# Patient Record
Sex: Male | Born: 1965 | Race: White | Hispanic: No | Smoking: Never smoker
Health system: Southern US, Community
[De-identification: ages and names within clinical notes are randomized; demographics above are authoritative.]

---

## 2008-10-17 ENCOUNTER — Encounter: Admission: RE | Admit: 2008-10-17 | Discharge: 2008-10-17 | Payer: Self-pay | Admitting: Family Medicine

## 2008-10-17 ENCOUNTER — Inpatient Hospital Stay (HOSPITAL_COMMUNITY): Admission: EM | Admit: 2008-10-17 | Discharge: 2008-10-31 | Payer: Self-pay | Admitting: Emergency Medicine

## 2008-10-17 ENCOUNTER — Encounter (INDEPENDENT_AMBULATORY_CARE_PROVIDER_SITE_OTHER): Payer: Self-pay | Admitting: General Surgery

## 2008-10-24 ENCOUNTER — Encounter (INDEPENDENT_AMBULATORY_CARE_PROVIDER_SITE_OTHER): Payer: Self-pay | Admitting: General Surgery

## 2009-10-20 IMAGING — CR DG ABDOMEN 2V
2 series · 2 of 2 positions shown · non-contrast
Comparison: CT abdomen and pelvis 10/17/2008.

CLINICAL DATA: Abdominal pain.  Acute appendicitis.

ABDOMEN - 2 VIEW

[w abdomen upright]
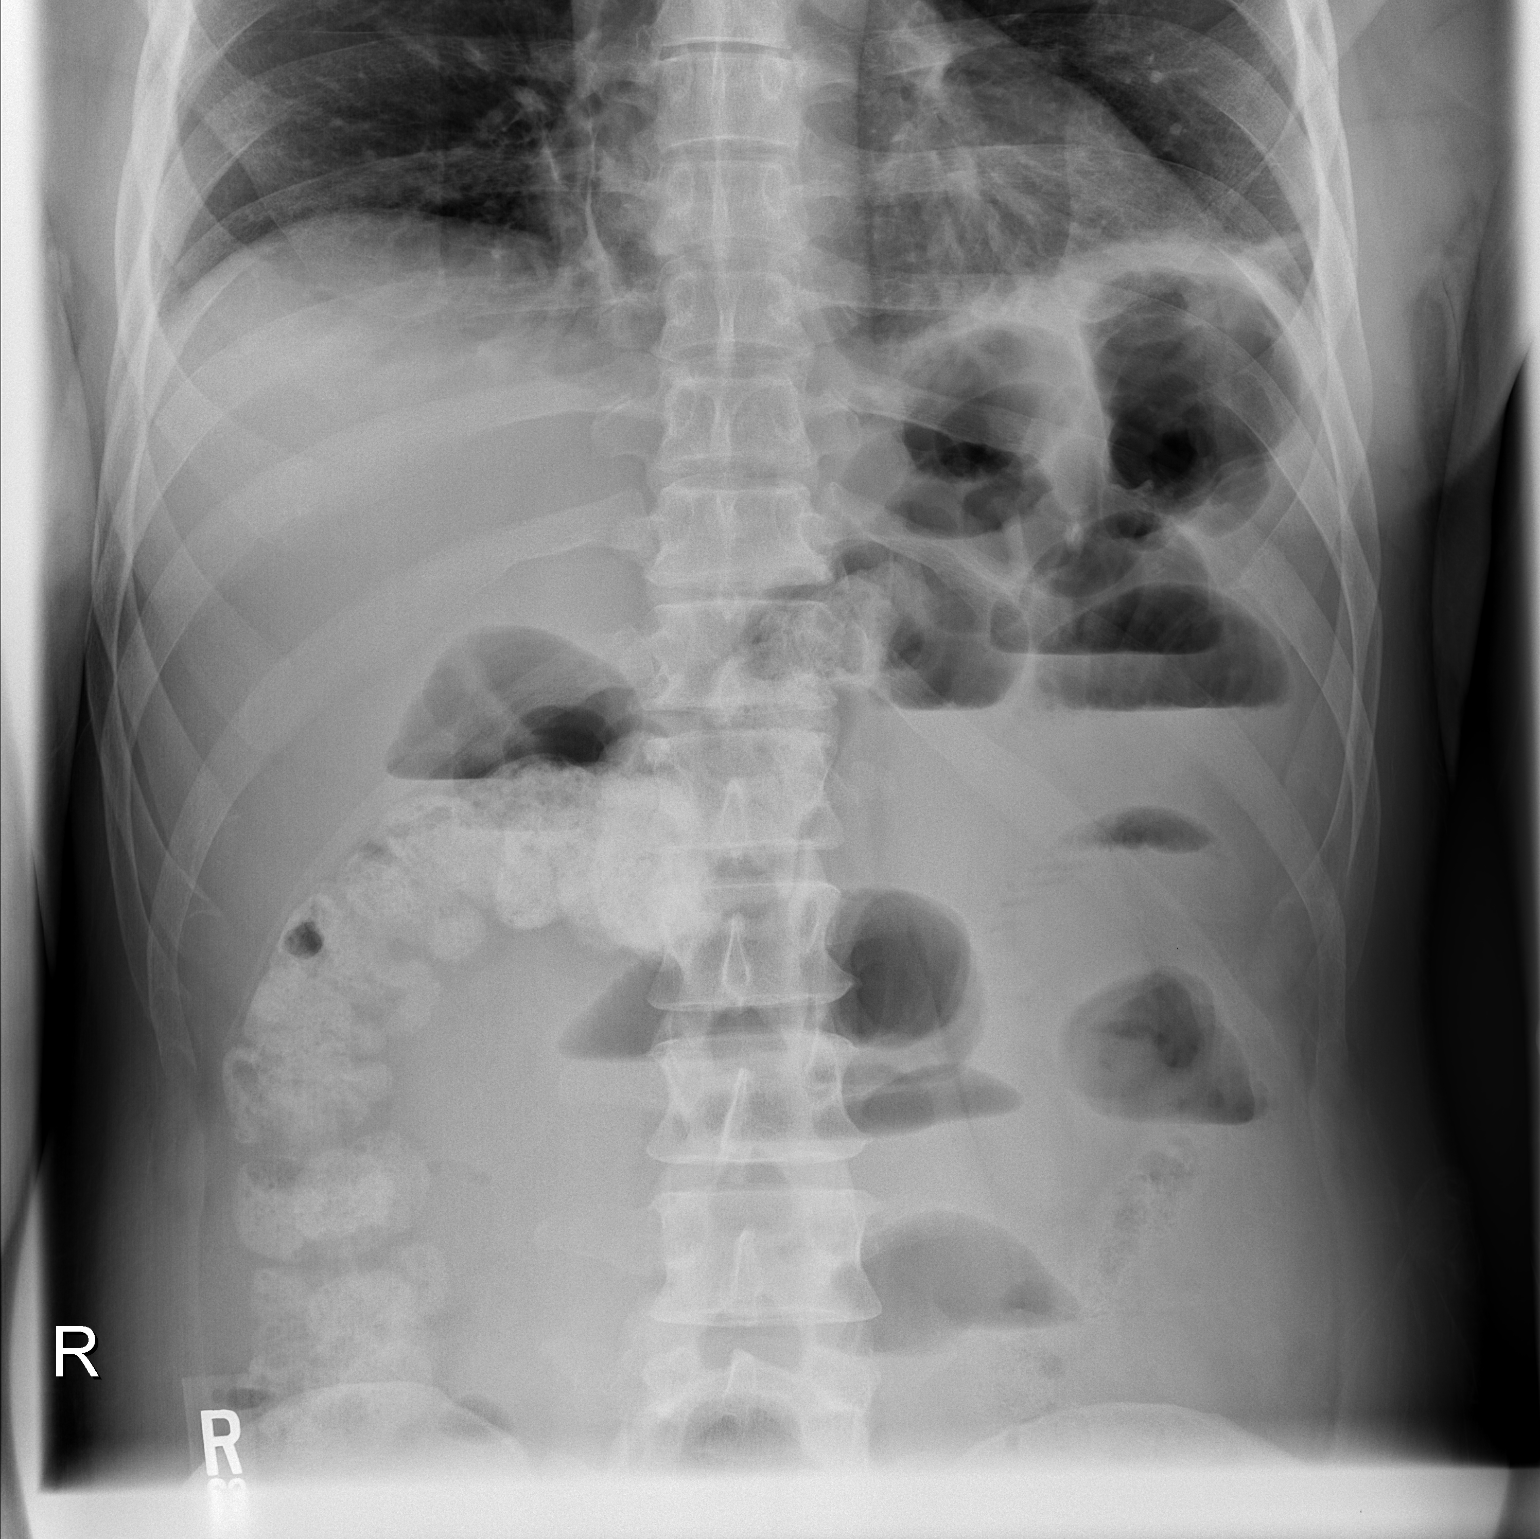

[t abdomen supine]
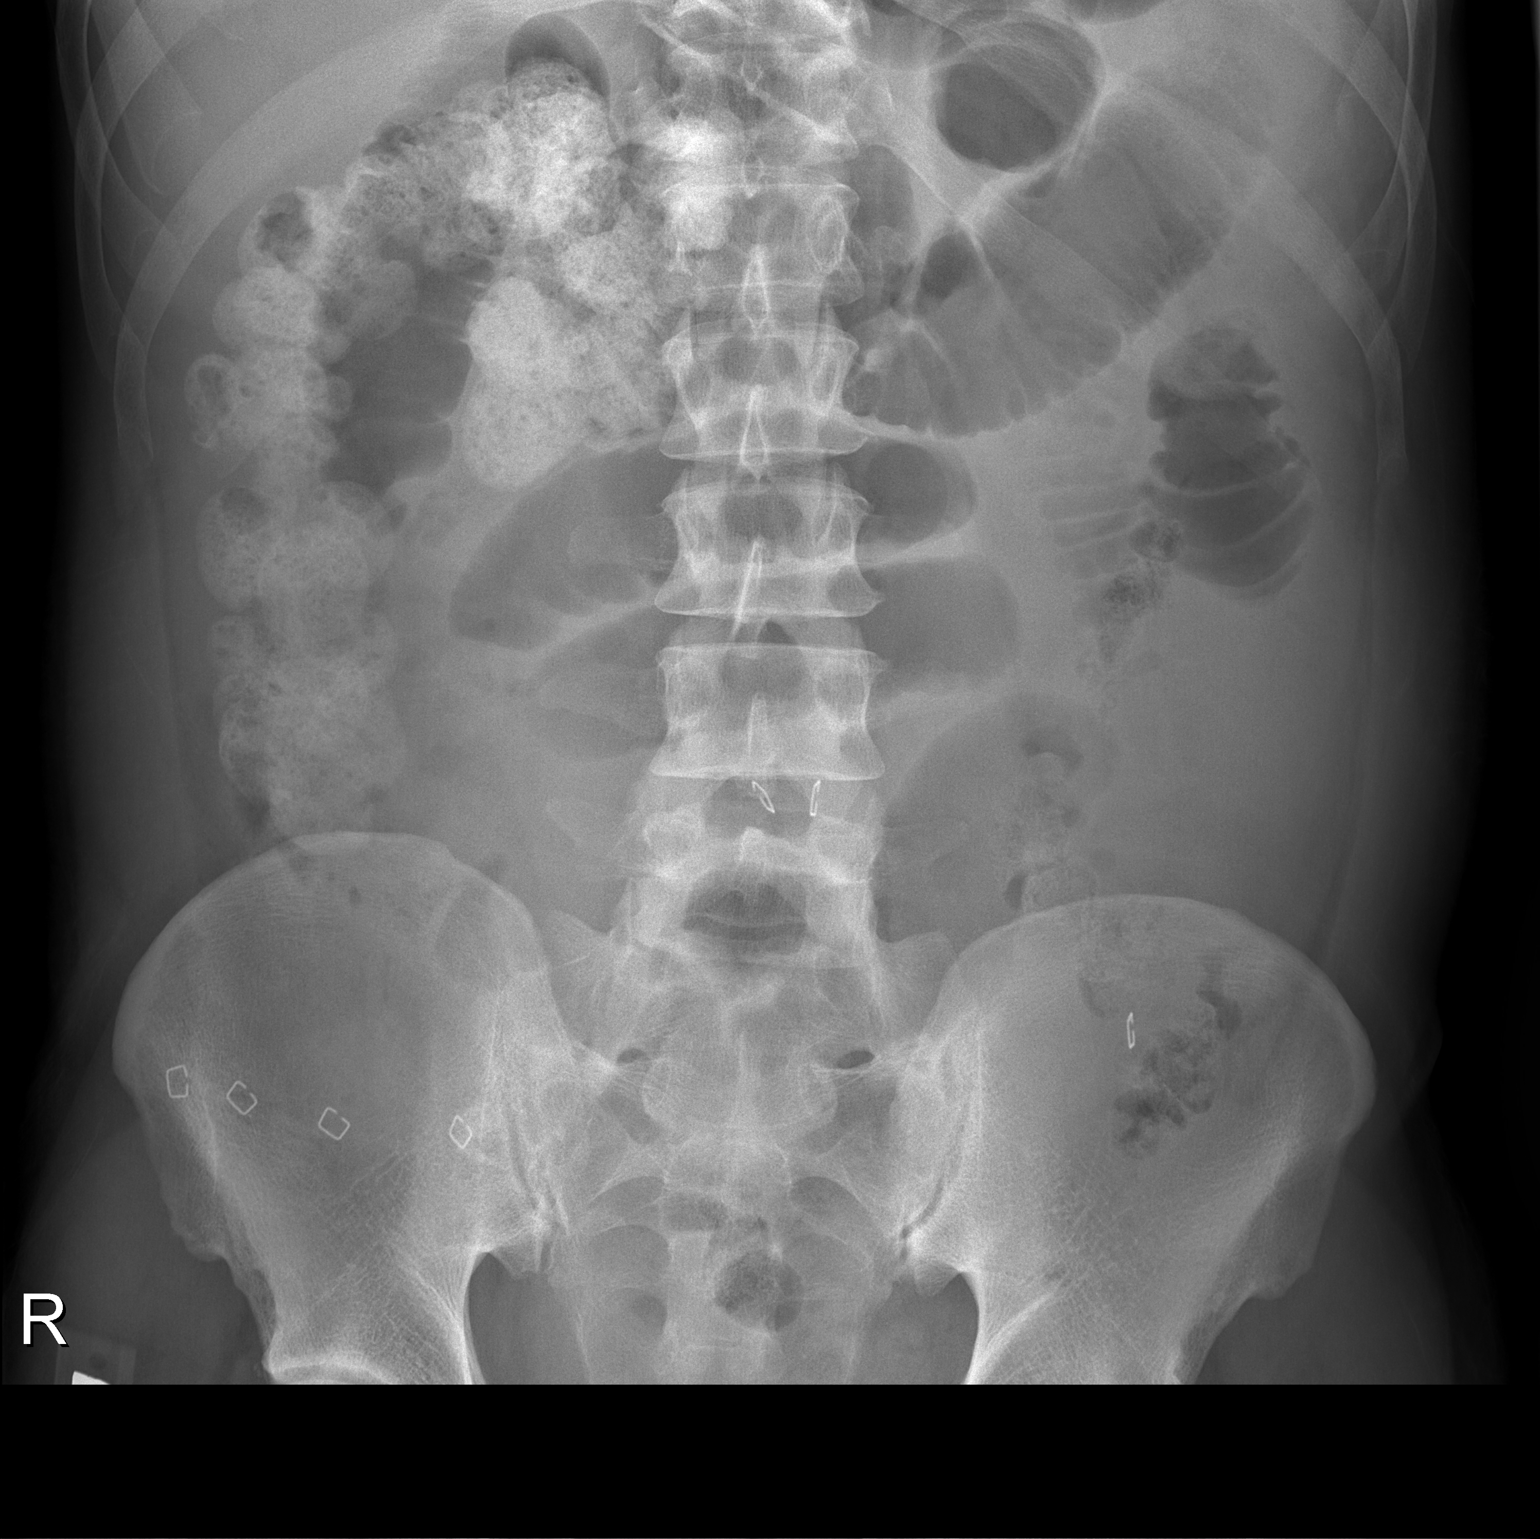

[2 of 2 positions shown; findings below may reference images not displayed]

FINDINGS: No free intraperitoneal air is identified.  There is
dilatation of small bowel loops of to 5.3 cm with air-fluid levels
present.  Contrast material from the patient's CT scan is seen in
the ascending colon.  Calcific density projecting in the inferior
aspect the right pelvis is noted.  No free intraperitoneal air.
Bibasilar atelectasis seen.
IMPRESSION: 1.  Bowel gas pattern most compatible with postoperative ileus.
2.  Calcification in the right hemi pelvis may represent what of
the appendicoliths seen on the patient's CT scan.

## 2010-09-10 LAB — BASIC METABOLIC PANEL
BUN: 12 mg/dL (ref 6–23)
BUN: 7 mg/dL (ref 6–23)
CO2: 29 mEq/L (ref 19–32)
Calcium: 8.2 mg/dL — ABNORMAL LOW (ref 8.4–10.5)
Calcium: 8.5 mg/dL (ref 8.4–10.5)
Chloride: 100 mEq/L (ref 96–112)
Chloride: 103 mEq/L (ref 96–112)
Chloride: 99 mEq/L (ref 96–112)
Creatinine, Ser: 0.91 mg/dL (ref 0.4–1.5)
Creatinine, Ser: 0.93 mg/dL (ref 0.4–1.5)
Creatinine, Ser: 1.04 mg/dL (ref 0.4–1.5)
GFR calc Af Amer: >60 mL/min (ref >60)
GFR calc Af Amer: >60 mL/min (ref >60)
GFR calc Af Amer: >60 mL/min (ref >60)
GFR calc non Af Amer: >60 mL/min (ref >60)
GFR calc non Af Amer: >60 mL/min (ref >60)
Glucose, Bld: 116 mg/dL — ABNORMAL HIGH (ref 70–99)
Glucose, Bld: 142 mg/dL — ABNORMAL HIGH (ref 70–99)
Potassium: 3.8 mEq/L (ref 3.5–5.1)
Sodium: 134 mEq/L — ABNORMAL LOW (ref 135–145)
Sodium: 136 mEq/L (ref 135–145)

## 2010-09-10 LAB — DIFFERENTIAL
Basophils Relative: 1 % (ref 0–1)
Basophils Relative: 3 % — ABNORMAL HIGH (ref 0–1)
Eosinophils Absolute: 0 10*3/uL (ref 0.0–0.7)
Eosinophils Relative: 3 % (ref 0–5)
Lymphocytes Relative: 10 % — ABNORMAL LOW (ref 12–46)
Lymphocytes Relative: 15 % (ref 12–46)
Lymphs Abs: 1 10*3/uL (ref 0.7–4.0)
Lymphs Abs: 1 10*3/uL (ref 0.7–4.0)
Lymphs Abs: 1 10*3/uL (ref 0.7–4.0)
Monocytes Absolute: 1.4 10*3/uL — ABNORMAL HIGH (ref 0.1–1.0)
Monocytes Relative: 10 % (ref 3–12)
Monocytes Relative: 10 % (ref 3–12)
Monocytes Relative: 12 % (ref 3–12)
Neutro Abs: 12 10*3/uL — ABNORMAL HIGH (ref 1.7–7.7)
Neutro Abs: 7.3 10*3/uL (ref 1.7–7.7)
Neutrophils Relative %: 77 % (ref 43–77)
Neutrophils Relative %: 82 % — ABNORMAL HIGH (ref 43–77)

## 2010-09-10 LAB — GLUCOSE, CAPILLARY
Glucose-Capillary: 102 mg/dL — ABNORMAL HIGH (ref 70–99)
Glucose-Capillary: 111 mg/dL — ABNORMAL HIGH (ref 70–99)
Glucose-Capillary: 112 mg/dL — ABNORMAL HIGH (ref 70–99)
Glucose-Capillary: 114 mg/dL — ABNORMAL HIGH (ref 70–99)
Glucose-Capillary: 121 mg/dL — ABNORMAL HIGH (ref 70–99)
Glucose-Capillary: 124 mg/dL — ABNORMAL HIGH (ref 70–99)
Glucose-Capillary: 126 mg/dL — ABNORMAL HIGH (ref 70–99)
Glucose-Capillary: 126 mg/dL — ABNORMAL HIGH (ref 70–99)
Glucose-Capillary: 128 mg/dL — ABNORMAL HIGH (ref 70–99)
Glucose-Capillary: 128 mg/dL — ABNORMAL HIGH (ref 70–99)

## 2010-09-10 LAB — CBC
HCT: 32.6 % — ABNORMAL LOW (ref 39.0–52.0)
HCT: 34.2 % — ABNORMAL LOW (ref 39.0–52.0)
HCT: 44 % (ref 39.0–52.0)
Hemoglobin: 11.4 g/dL — ABNORMAL LOW (ref 13.0–17.0)
Hemoglobin: 11.7 g/dL — ABNORMAL LOW (ref 13.0–17.0)
Hemoglobin: 14.4 g/dL (ref 13.0–17.0)
MCHC: 33.2 g/dL (ref 30.0–36.0)
MCHC: 33.4 g/dL (ref 30.0–36.0)
MCHC: 33.8 g/dL (ref 30.0–36.0)
MCHC: 34 g/dL (ref 30.0–36.0)
MCHC: 35.4 g/dL (ref 30.0–36.0)
MCV: 89.3 fL (ref 78.0–100.0)
MCV: 90.3 fL (ref 78.0–100.0)
MCV: 90.5 fL (ref 78.0–100.0)
MCV: 90.8 fL (ref 78.0–100.0)
MCV: 91.2 fL (ref 78.0–100.0)
Platelets: 177 10*3/uL (ref 150–400)
Platelets: 181 10*3/uL (ref 150–400)
Platelets: 182 10*3/uL (ref 150–400)
Platelets: 222 10*3/uL (ref 150–400)
RBC: 3.62 MIL/uL — ABNORMAL LOW (ref 4.22–5.81)
RBC: 3.92 MIL/uL — ABNORMAL LOW (ref 4.22–5.81)
RBC: 4.07 MIL/uL — ABNORMAL LOW (ref 4.22–5.81)
RBC: 4.71 MIL/uL (ref 4.22–5.81)
RDW: 12.2 % (ref 11.5–15.5)
RDW: 12.5 % (ref 11.5–15.5)
RDW: 13.1 % (ref 11.5–15.5)
WBC: 14.7 10*3/uL — ABNORMAL HIGH (ref 4.0–10.5)
WBC: 6.5 10*3/uL (ref 4.0–10.5)
WBC: 7.7 10*3/uL (ref 4.0–10.5)

## 2010-09-10 LAB — PHOSPHORUS
Phosphorus: 3 mg/dL (ref 2.3–4.6)
Phosphorus: 3.4 mg/dL (ref 2.3–4.6)
Phosphorus: 4.1 mg/dL (ref 2.3–4.6)

## 2010-09-10 LAB — COMPREHENSIVE METABOLIC PANEL
ALT: 20 U/L (ref 0–53)
ALT: 24 U/L (ref 0–53)
AST: 22 U/L (ref 0–37)
AST: 23 U/L (ref 0–37)
Albumin: 2.3 g/dL — ABNORMAL LOW (ref 3.5–5.2)
Alkaline Phosphatase: 43 U/L (ref 39–117)
BUN: 5 mg/dL — ABNORMAL LOW (ref 6–23)
BUN: 8 mg/dL (ref 6–23)
CO2: 29 mEq/L (ref 19–32)
CO2: 31 mEq/L (ref 19–32)
Calcium: 7.6 mg/dL — ABNORMAL LOW (ref 8.4–10.5)
Calcium: 8.1 mg/dL — ABNORMAL LOW (ref 8.4–10.5)
Chloride: 102 mEq/L (ref 96–112)
Chloride: 102 mEq/L (ref 96–112)
Creatinine, Ser: 0.66 mg/dL (ref 0.4–1.5)
Creatinine, Ser: 0.66 mg/dL (ref 0.4–1.5)
Creatinine, Ser: 0.76 mg/dL (ref 0.4–1.5)
GFR calc Af Amer: >60 mL/min (ref >60)
GFR calc Af Amer: >60 mL/min (ref >60)
GFR calc non Af Amer: >60 mL/min (ref >60)
GFR calc non Af Amer: >60 mL/min (ref >60)
Glucose, Bld: 104 mg/dL — ABNORMAL HIGH (ref 70–99)
Glucose, Bld: 133 mg/dL — ABNORMAL HIGH (ref 70–99)
Sodium: 134 mEq/L — ABNORMAL LOW (ref 135–145)
Sodium: 138 mEq/L (ref 135–145)
Total Bilirubin: 0.8 mg/dL (ref 0.3–1.2)
Total Bilirubin: 0.8 mg/dL (ref 0.3–1.2)
Total Protein: 4.7 g/dL — ABNORMAL LOW (ref 6.0–8.3)
Total Protein: 5.3 g/dL — ABNORMAL LOW (ref 6.0–8.3)

## 2010-09-10 LAB — WOUND CULTURE

## 2010-09-10 LAB — TRIGLYCERIDES: Triglycerides: 75 mg/dL (ref <150)

## 2010-09-10 LAB — CLOSTRIDIUM DIFFICILE EIA: C difficile Toxins A+B, EIA: NEGATIVE

## 2010-09-10 LAB — ANAEROBIC CULTURE

## 2010-09-10 LAB — GRAM STAIN

## 2010-09-10 LAB — CULTURE, ROUTINE-ABSCESS: Culture: NO GROWTH

## 2010-09-10 LAB — CHOLESTEROL, TOTAL: Cholesterol: 113 mg/dL (ref 0–200)

## 2010-09-10 LAB — PREALBUMIN: Prealbumin: 6.2 mg/dL — ABNORMAL LOW (ref 18.0–45.0)

## 2010-10-15 NOTE — Op Note (Signed)
NAME:  Jeffery Massey, Jeffery Massey NO.:  [id number]   MEDICAL RECORD NO.:  [id number]          PATIENT TYPE:  INP   LOCATION:  1321                         FACILITY:  [facility]   PHYSICIAN:  Angelia Mould. Derrell Lolling, M.D.DATE OF BIRTH:  Dec 26, 1965   DATE OF PROCEDURE:  10/24/2008  DATE OF DISCHARGE:                               OPERATIVE REPORT   PREOPERATIVE DIAGNOSIS:  Small-bowel obstruction, pelvic abscess,  retained appendicolith.   POSTOPERATIVE DIAGNOSIS:  Small-bowel obstruction secondary to  adhesions, early pelvic abscess, retained appendicolith.   OPERATION PERFORMED:  1. Exploratory laparotomy with lysis of adhesions to release small      bowel obstruction.  2. Drainage of pelvic abscess.  3. Removal of pelvic appendicolith.   SURGEON:  Dr. Claud Kelp.   FIRST ASSISTANT:  Dr. Glenna Fellows.   OPERATIVE INDICATIONS:  This is a 45 year old Caucasian male who  underwent diagnostic laparoscopy and open appendectomy for ruptured  appendicitis with abscess on Oct 17, 2008.  In retrospect, he probably  had been sick for over 1 week.  He initially did well but then developed  abdominal distention and vomiting and radiographic pattern which  initially looked like an ileus, progressed to one of more of mechanical  obstruction.  CT scan yesterday showed a pelvic fluid collection with a  retained appendicolith and was beginning to develop wall enhancement.  The small bowel was dilated all the way down to the right lower  quadrant. I felt that he would need re-exploration to remove the  appendicolith, to drain the infected fluid out of his pelvis and to  evaluate him for an acute postop small-bowel obstruction.  After  extensive discussion, this was decided upon and he is brought to  operating room.   OPERATIVE FINDINGS:  The patient appeared to have a partial small-bowel  obstruction due to adhesions in the right lower quadrant.  The last 6  inches of terminal ileum  appeared to have rolled over themselves and  adhered to the cecal closure and inflammatory change in the right lower  quadrant.  Once these adhesions were taken down, I was able to milk air  and liquid stool through the small bowel into the right colon without  any difficulty.  There is no sign of any injury to the bowel.  There was  a lot of ascites in the abdomen which was slightly blood tinged, but  there was no odor or purulence that I could find.  The pelvic abscess  really was not very well developed and was mostly the cloudy fluid  without odor.  I did remove the appendicolith from the pelvis.   OPERATIVE TECHNIQUE:  Following induction of general endotracheal  anesthesia, a Foley catheter was inserted.  The patient's abdomen was  prepped and draped in sterile fashion.  The patient was identified as  correct patient and correct procedure.   The right lower quadrant transverse incision was observed and the  staples were removed.  I opened up the skin.  I extended this incision  all the way to the midline and divided the subcutaneous tissue.  I then  carefully took the PDS  sutures out of the anterior rectus sheath and the  external oblique laterally and then out of the internal oblique and  transversus abdominis laterally.  I divided the medial aspect of the  rectus muscle with electrocautery.  I carefully elevated the posterior  rectus sheath and with a knife carefully made a pinhole entry into the  abdominal cavity which was performed without any injury to the bowel.  We then slowly opened this hole and evacuated a lot of clear to slightly  blood tinged ascites.  We evacuated the small bowel and then identified  the terminal ileum and ran that all the way down to the right lower  quadrant.  We identified the right colon and cecum.  We had some kinking  of the terminal ileum and those adhesions were taken down mostly bluntly  but slightly sharply and once we had this completely  free, we could  completely trace the small bowel all the way to the ileocecal valve and  milk small bowel content easily into the cecum.  We felt that we had  resolved the small-bowel obstruction.  We examined the rest of the small  bowel and there was no other adhesions.  I then removed the small bowel  from the pelvis.  I felt down in the pelvis and felt the appendicolith  and removed that.  That was sent to the lab for documentation.  I took  cultures of the pelvic fluid but really there was no well formed abscess  and there was no well formed abscess cavity, so we did not feel the  drains were necessary.   We then ran the small bowel one more time and irrigated the abdomen and  pelvis extensively with about 4 L of saline.  There did not appear to be  any bleeding or any further fluid to remove.  We returned the small  bowel to its anatomic position.  We closed the internal oblique and  transversus abdominis and the posterior rectus sheath with running  suture of #1 PDS.  We closed the external oblique and the anterior  rectus sheath with running suture of #1 PDS.  We closed the skin with  about three or four skin staples but mostly packed it open with Telfa  and covered with 4x4 gauze.  Clean bandages were placed and the patient  was taken to the recovery room in stable condition.  Estimated blood  loss was about 100 mL.  Complications none.  Sponge, needle and  instrument counts were correct.      Angelia Mould. Derrell Lolling, M.D.  Electronically Signed     HMI/MEDQ  D:  10/24/2008  T:  10/25/2008  Job:  [id number]

## 2010-10-15 NOTE — H&P (Signed)
NAME:  Jeffery Massey, Jeffery Massey NO.:  [id number]   MEDICAL RECORD NO.:  [id number]          PATIENT TYPE:  INP   LOCATION:  1321                         FACILITY:  [facility]   PHYSICIAN:  Angelia Mould. Derrell Lolling, M.D.DATE OF BIRTH:  06/27/1965   DATE OF ADMISSION:  10/17/2008  DATE OF DISCHARGE:                              HISTORY & PHYSICAL   CHIEF COMPLAINT:  Right lower quadrant abdominal pain and nausea.   HISTORY OF PRESENT ILLNESS:  This is a healthy 45 year old gentleman who  has had no prior abdominal problems.  For the past 2 weeks, he states  that his abdomen has felt a little funny and a little tight, but he  has been reportedly well otherwise, exercising ,tolerating a diet and  having bowel movements.  His symptomatology changed yesterday afternoon  when he noted diminished appetite and his abdomen was a little bit  tighter.  At about midnight last night, he noted worsening pain and  cramps, and had a normal bowel movement.  He has had 1-2 episodes of  chills.  Early this morning, he noted that the pain was on the right  side.  He was more fatigued and nauseated, but did not vomit.  He has  not had any diarrhea.  Denies prior episodes.  Denies urologic symptoms.   He went to [facility].  A CT scan was performed.  This shows an  abnormally thickened, enlarged and inflamed appendix with 2  appendicoliths.  There is a significant inflammatory reaction around  this, but no abscess.  There is a little bit of fluid of the right  pericolic gutter and a little bit of fluid in the pelvis.  There are no  other abnormalities mentioned.  I reviewed this with Dr. Genevive Bi  in the Radiology Department.   I was called to see the patient by the emergency department physician.   PAST HISTORY:  In August 2090, he had a parathyroid adenoma resected in  [place], [place].  He has had no other medical or surgical problems.   CURRENT MEDICATIONS:  Multivitamins.   ALLERGIES:  CODEINE causes sweating and cramps.   SOCIAL HISTORY:  He is married with 2 children.  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  He plans to move his family down here later.  Both  his mother and father live in [place] already.  He denies tobacco.  He drinks 1 alcoholic beverage a day.   FAMILY HISTORY:  Mother and father are with him.  They are living and  well.  Father states he has an enlarged prostate.   REVIEW OF SYSTEMS:  A 10-system review of systems is performed and is  noncontributory except for what is listed above in the history.   PHYSICAL EXAMINATION:  GENERAL:  Pleasant healthy-appearing gentleman in  mild-moderate distress.  VITAL SIGNS:  Temperature 100.1, blood pressure 136/81, pulse 81,  respirations 20, oxygen saturation 99%.  HEENT:  Eyes:  Sclerae are clear.  Extraocular movements are intact.  Ears, nose, mouth, throat, lips, tongue and oropharynx  without gross  lesions.  NECK:  Supple, nontender.  No mass.  No jugular venous distention.  Well-  healed transverse scar above the suprasternal notch.  LUNGS:  Clear to auscultation.  No chest wall tenderness.  HEART:  Regular rate and rhythm.  No murmur.  Radial, femoral and  posterior pulses are palpable.  No ectopy.  ABDOMEN:  Diminished bowel sounds.  Mostly soft, but exquisitely tender  with involuntary guarding and spasticity in the right lower quadrant.  I  do not feel a mass.  There are no scars.  There are no hernias.  GENITOURINARY:  There is no inguinal hernia or mass.  EXTREMITIES:  He moves all 4 extremities well without pain or deformity.  NEUROLOGIC:  No gross motor or sensory deficits.   ADMISSION LABORATORY DATA:  Hemoglobin 14.4, white blood cell count  14,700.  Basic metabolic panel is unremarkable.  Urinalysis pending.   ASSESSMENT:  Acute appendicitis, possibly with rupture.   PLAN:  1. The patient will  be admitted to the hospital, started on IV fluid      hydration, IV antibiotics.  2. The patient will be taken to the operating room urgently for      diagnostic laparoscopy and appendectomy.  3. I have discussed the indication and details of surgery with the      patient and his family.  Risks and complications have been      outlined, including but not limited to bleeding, infection,      conversion to open laparotomy, injury to adjacent organs such as      the ureter,  small intestine or colon with major reconstructive      surgery, wound problems such as  infection or hernia, cardiac,      pulmonary and thromboembolic problems.  He seems to understand this      well.  At this time, all his questions are answered.  He is in full      agreement with this plan.      Angelia Mould. Derrell Lolling, M.D.  Electronically Signed     HMI/MEDQ  D:  10/17/2008  T:  10/17/2008  Job:  [id number]   cc:   [facility]

## 2010-10-15 NOTE — Op Note (Signed)
NAME:  Jeffery Massey, Jeffery Massey NO.:  [id number]   MEDICAL RECORD NO.:  [id number]          PATIENT TYPE:  INP   LOCATION:  0098                         FACILITY:  [facility]   PHYSICIAN:  Angelia Mould. Derrell Lolling, M.D.DATE OF BIRTH:  December 16, 1965   DATE OF PROCEDURE:  10/17/2008  DATE OF DISCHARGE:                               OPERATIVE REPORT   PREOPERATIVE DIAGNOSIS:  Acute appendicitis.   POSTOPERATIVE DIAGNOSES:  Ruptured appendicitis with peritonitis.   OPERATION PERFORMED:  Diagnostic laparoscopy, open appendectomy with  drainage of abscess.   SURGEON:  Dr. Claud Kelp.   OPERATIVE INDICATIONS:  This is a very healthy 45 year old Caucasian  male.  He has had some minor abdominal discomfort for 2 weeks, but  really has not been sick.  For the past 24 hours he has had right lower  quadrant pain and nausea and says the pain was much worse.  A CT scan  today showed acute appendicitis with a lot of inflammatory change around  it and two appendicoliths.  I was asked to see him in the emergency  room.  He was very tender in the right lower quadrant.  White blood cell  count was elevated.  He is brought to operating room urgently.   OPERATIVE FINDINGS:  The patient had severe acute retrocecal  appendicitis with a rupture in the midbody of the appendix and a small  abscess lateral to the cecum.  This looked like the appendicitis was  several days old as the cecum and the appendix were rock hard and  densely adherent to the right lateral sidewall.  I was unable to clarify  the anatomy laparoscopically and had to convert this to an open  procedure.  The liver looked normal.  The terminal ileum and ascending  colon looked normal.  The rectosigmoid looked normal.   OPERATIVE TECHNIQUE:  Following the induction of general endotracheal  anesthesia the patient's abdomen was prepped and draped in a sterile  fashion.  Intravenous antibiotics were infused prior to the incision.  The  patient was identified as the correct patient and correct procedure.  Marcaine 0.5% with epinephrine was used as a local infiltration  anesthetic.  A curved transverse incision was made at the superior rim  of the umbilicus.  The fascia was incised in the midline and the  abdominal cavity entered under direct vision.  A 10-mm Hassan trocar was  inserted and secured with a pursestring suture of 0 Vicryl.  Pneumoperitoneum was created.  Video camera was inserted with findings  as described above.  I placed a 5-mm trocar in the left midabdomen and a  12 mm trocar in the left suprapubic area.  I identified the terminal  ileum and distal ileum and mobilized it up out of the pelvis.  I  identified the ascending colon.  The cecum was densely adherent to the  anterolateral abdominal sidewall.  I was able to tease this away from  the sidewall some and I encountered an abscess.  I continued to try to  mobilize the cecum and the proximal ascending colon by dividing lateral  peritoneal attachments, but there was a significant  mass effect due to  the inflammatory process and I was unable to clearly identify the  appendix.  I felt it would be unwise to persist and so I abandoned the  laparoscopic approach.  The pneumoperitoneum was released.  The fascia  at the umbilicus was closed after removing the trocar.  This closure was  with 0 Vicryl sutures.   A right lower quadrant transverse incision was made.  The dissection was  carried down through the subcutaneous tissue.  I divided the anterior  rectus sheath in its lateral part and then the lateral part of the  rectus muscle.  I elevated the fascia and incised the posterior rectus  sheath and entered the peritoneal cavity.  I then divided the posterior  rectus sheath somewhat and then the external oblique and internal  oblique and transversus abdominis muscles until I could place two medium  to large Richardson retractors in there.  We irrigated and  evacuated out  a little bit of bloody fluid.  We could feel a mass effect in the cecum.  I had to mobilize the cecum more extensively by dividing its lateral  peritoneal attachments and mobilizing it up out of the retroperitoneum.  Ultimately, I could deliver the entire terminal ileum and cecum into the  wound.  At this point, I could identify the markedly abnormal appendix.  I could identify the tip of the appendix.  I could see that the  midportion of the appendix was completely ruptured and transected due to  inflammatory process.  I slowly mobilized the appendix in steps.  Some  of the mesentery was divided with the harmonic scalpel.  I tried to  divide the appendiceal artery with the harmonic scalpel, but that did  not control the bleeding and so I had to control the appendiceal artery  with a 2-0 silk suture ligature.  I continued to mobilize the appendix  all the way back up to the cecum until I could clearly see the junction  of the appendix with the cecum.  I placed a TA-30 stapler with blue load  staplers across the base of the appendix and cecum.  I closed the  stapler, fired it, divided the tissue with a knife and removed the  specimen, sending it to pathology and then removing the stapler.  The  staple line looked good.  I over sewed it with interrupted imbricating  sutures of 2-0 Vicryl.   I then spent a long time irrigating out the lower abdomen and pelvis  with about 2000 mL of saline.  I got all of the bloody fluid out.  I  then looked at the terminal ileum and cecum one more time and everything  looked fine.  I did not see any evidence of any other injury.  There was  no active bleeding that I could detect.  The terminal ileum and cecum  returned to their anatomic positions.  The peritoneum was closed with a  running suture of #1 PDS.  The posterior rectus sheath, transversus  abdominis and internal oblique were closed with a running suture of #1  PDS.  The anterior  rectus sheath and the external oblique were closed  with a running suture of #1 PDS.  We irrigated each layer.  All the skin  incisions were closed loosely with skin staples and we placed some Telfa  wicks in between the staples of the right lower quadrant scar.  Clean  bandages were placed and the patient taken to the  recovery room in  stable condition.  Estimated blood loss was about 30-50 mL.  Complications none.  Sponge, needle and instrument counts were correct.      Angelia Mould. Derrell Lolling, M.D.  Electronically Signed     HMI/MEDQ  D:  10/17/2008  T:  10/17/2008  Job:  [id number]   cc:   [facility]  [address]

## 2010-10-18 NOTE — Discharge Summary (Signed)
NAME:  TJ, KITCHINGS NO.:  [id number]   MEDICAL RECORD NO.:  [id number]          PATIENT TYPE:  INP   LOCATION:  1321                         FACILITY:  [facility]   PHYSICIAN:  Angelia Mould. Derrell Lolling, M.D.DATE OF BIRTH:  1966-01-03   DATE OF ADMISSION:  10/17/2008  DATE OF DISCHARGE:  10/31/2008                               DISCHARGE SUMMARY   FINAL DIAGNOSES:  1. Ruptured appendicitis with periappendiceal abscess.  2. Postoperative small-bowel obstruction secondary to adhesions.  3. Retained appendicolith with developing pelvic abscess.   OPERATIONS PERFORMED:  1. Diagnostic laparoscopy, open appendectomy with drainage of abscess,      date Oct 17, 2008.  2. Exploratory laparotomy with lysis of adhesions to release small      bowel obstruction, drainage of pelvic abscess, removal of      appendicolith, date Oct 24, 2008.   HISTORY:  This is a healthy 45 year old gentleman who presented to the  emergency room with a 2-week history of kind of vague tightness in his  abdomen but without any nausea, vomiting or fever.  He stated that for  24 hours he had had diminished appetite and a tighter abdomen with  worsening cramps and pain and fatigue and nausea but no vomiting.  A CT  scan was performed in the emergency room.  This showed abnormally,  thickened, enlarged and inflamed appendix with 2 appendicoliths and  significant inflammatory reaction around this but no abscess or free  air.   I saw the patient in the emergency department.  His physical exam showed  significant tenderness and involuntary guarding and spasticity in the  right lower quadrant but no mass.  White blood cell count was 14,700.  I  felt that he had acute appendicitis, possibly with rupture.  Surgical  intervention was advised.   HOSPITAL COURSE:  The patient was taken to the operating room.  We did a  diagnostic laparoscopy and I found that his cecum and appendix were  densely adherent to the  right paracolic abdominal wall, and we could not  safely identify the anatomy nor do the dissection with the laparoscopic  instruments.  We converted to an open procedure through a right lower  quadrant transverse incision and we were able to mobilize up the  terminal ileum and cecum and appendix, and we could clearly see the  appendix and did an appendectomy, stapling off the base of the appendix  and then imbricating the cecal wall with Vicryl sutures.  We washed  everything out and closed with the wound.   Postoperatively the patient did well for the first 24 hours.  He was  kept on broad spectrum antibiotics.  He became distended on postop day  #2 and x-rays looked like an ileus, and he was treated as such with IV  fluids and bowel rest.   Over the weekend of May 22 and 23, he was followed by Dr. Darnell Level,  who felt that this was most likely an ileus.  The patient had had some  nausea and emesis and the NG tube was considered but was not done.   I returned to see  him on May 24.  His abdominal films that morning  showed ileus versus small-bowel obstruction.  He was still distended and  somewhat tympanitic.  His white blood cell count was down to 6500.  I  was concerned that he might be developing a small-bowel obstruction.  I  did a CT scan, which showed probable a small-bowel obstruction with  possibly a loop or two of distal ileum collapsed in the right lower  quadrant.  A fecalith was noted in the pelvis and possibly developing a  rim-enhancing abscess in the pelvis.  At this point in time, we elected  see if this would resolve with nasogastric suction and put an NG tube  in.   It should be noted that he also had a mild right arm phlebitis.  He was  put on warm soaks and IV vancomycin for that, and that resolved in about  3 days.   On May 25, he was about the same, had a low grade temperature of 100.5.  abdomen was still distended, still tympanic, still silent.  CBC was   normal as well.  I felt that the patient probably should be explored.  I  discussed the case and showed the x-rays to a couple of my partners, and  they agreed.   He was taken back to the operating room on Oct 24, 2008, and we explored  him again through the right lower quadrant transverse incision, but we  had to extend the incision over to the midline for good visualization.  I found that he probably had a mechanical small-bowel obstruction from  the distal ileum kinking on the adhesions around the appendectomy site.  These were simply taken down and then we could milk small-bowel content  into the colon without any difficulty.  There was no ischemia or injury  to the bowel.  We reached down into the pelvis and retrieved a fecalith,  and the fluid in the pelvis was simply watery, bloody fluid.  Cultures  were taken but were ultimately negative.  He was washed out and the  wounds were closed.   Postoperatively the patient improved thereafter.  Over the next several  days, the ileus resolved and he began passing flatus and stool, and his  diet was advanced.  In fact, he had diarrhea and a C. difficile a  culture was positive and he was started on p.o. Flagyl, and we  discontinued his Invanz.   The diarrhea resolved promptly and he did well and was ready for  discharge on June 1.  At that time his abdomen was soft, flat,  nontender.  The wounds looked good.  There were no signs of wound  infection.  He said the diarrhea had resolved and he was tolerating  regular food.   He was given a prescription for Vicodin ES for pain and a prescription  for Flagyl 500 mg 3 times a day for another 7 days.  He was asked to  return to the office in about 2 weeks and said that he could return to  work in 4 weeks.      Angelia Mould. Derrell Lolling, M.D.  Electronically Signed     HMI/MEDQ  D:  11/09/2008  T:  11/10/2008  Job:  [id number]

## 2016-03-04 DIAGNOSIS — Z23 Encounter for immunization: Secondary | ICD-10-CM | POA: Diagnosis not present

## 2016-07-18 DIAGNOSIS — Z0000 Encounter for general adult medical examination without abnormal findings: Secondary | ICD-10-CM | POA: Diagnosis not present

## 2016-07-18 DIAGNOSIS — N433 Hydrocele, unspecified: Secondary | ICD-10-CM | POA: Diagnosis not present

## 2016-07-18 DIAGNOSIS — Z125 Encounter for screening for malignant neoplasm of prostate: Secondary | ICD-10-CM | POA: Diagnosis not present

## 2016-07-18 DIAGNOSIS — Z13220 Encounter for screening for lipoid disorders: Secondary | ICD-10-CM | POA: Diagnosis not present

## 2016-07-18 DIAGNOSIS — Z131 Encounter for screening for diabetes mellitus: Secondary | ICD-10-CM | POA: Diagnosis not present

## 2016-07-18 DIAGNOSIS — Z1211 Encounter for screening for malignant neoplasm of colon: Secondary | ICD-10-CM | POA: Diagnosis not present

## 2016-08-19 DIAGNOSIS — R351 Nocturia: Secondary | ICD-10-CM | POA: Diagnosis not present

## 2016-08-19 DIAGNOSIS — N400 Enlarged prostate without lower urinary tract symptoms: Secondary | ICD-10-CM | POA: Diagnosis not present

## 2016-08-19 DIAGNOSIS — N433 Hydrocele, unspecified: Secondary | ICD-10-CM | POA: Diagnosis not present

## 2016-09-29 DIAGNOSIS — Z1211 Encounter for screening for malignant neoplasm of colon: Secondary | ICD-10-CM | POA: Diagnosis not present

## 2016-09-29 DIAGNOSIS — K640 First degree hemorrhoids: Secondary | ICD-10-CM | POA: Diagnosis not present

## 2016-09-29 DIAGNOSIS — Z8371 Family history of colonic polyps: Secondary | ICD-10-CM | POA: Diagnosis not present

## 2017-10-01 DIAGNOSIS — L723 Sebaceous cyst: Secondary | ICD-10-CM | POA: Diagnosis not present

## 2017-10-01 DIAGNOSIS — L309 Dermatitis, unspecified: Secondary | ICD-10-CM | POA: Diagnosis not present

## 2017-10-01 DIAGNOSIS — D229 Melanocytic nevi, unspecified: Secondary | ICD-10-CM | POA: Diagnosis not present

## 2017-10-01 DIAGNOSIS — S90851A Superficial foreign body, right foot, initial encounter: Secondary | ICD-10-CM | POA: Diagnosis not present

## 2017-10-01 DIAGNOSIS — D1809 Hemangioma of other sites: Secondary | ICD-10-CM | POA: Diagnosis not present

## 2018-04-22 DIAGNOSIS — D225 Melanocytic nevi of trunk: Secondary | ICD-10-CM | POA: Diagnosis not present

## 2018-04-22 DIAGNOSIS — Z23 Encounter for immunization: Secondary | ICD-10-CM | POA: Diagnosis not present

## 2018-04-22 DIAGNOSIS — Z808 Family history of malignant neoplasm of other organs or systems: Secondary | ICD-10-CM | POA: Diagnosis not present

## 2018-04-22 DIAGNOSIS — D2371 Other benign neoplasm of skin of right lower limb, including hip: Secondary | ICD-10-CM | POA: Diagnosis not present

## 2018-10-14 DIAGNOSIS — L309 Dermatitis, unspecified: Secondary | ICD-10-CM | POA: Diagnosis not present

## 2019-03-16 DIAGNOSIS — Z23 Encounter for immunization: Secondary | ICD-10-CM | POA: Diagnosis not present

## 2019-04-07 ENCOUNTER — Ambulatory Visit (INDEPENDENT_AMBULATORY_CARE_PROVIDER_SITE_OTHER): Payer: BLUE CROSS/BLUE SHIELD | Admitting: Podiatry

## 2019-04-07 ENCOUNTER — Other Ambulatory Visit: Payer: Self-pay

## 2019-04-07 ENCOUNTER — Ambulatory Visit (INDEPENDENT_AMBULATORY_CARE_PROVIDER_SITE_OTHER): Payer: BLUE CROSS/BLUE SHIELD

## 2019-04-07 ENCOUNTER — Other Ambulatory Visit: Payer: Self-pay | Admitting: Podiatry

## 2019-04-07 ENCOUNTER — Encounter: Payer: Self-pay | Admitting: Podiatry

## 2019-04-07 VITALS — BP 103/65 | HR 72 | Resp 16

## 2019-04-07 DIAGNOSIS — M7661 Achilles tendinitis, right leg: Secondary | ICD-10-CM

## 2019-04-07 DIAGNOSIS — M7672 Peroneal tendinitis, left leg: Secondary | ICD-10-CM

## 2019-04-07 DIAGNOSIS — M79672 Pain in left foot: Secondary | ICD-10-CM

## 2019-04-07 DIAGNOSIS — M79671 Pain in right foot: Secondary | ICD-10-CM

## 2019-04-07 MED ORDER — DICLOFENAC SODIUM 75 MG PO TBEC: 75.0000 mg | DELAYED_RELEASE_TABLET | Freq: Two times a day (BID) | ORAL | 2 refills | Status: AC

## 2019-04-07 NOTE — Progress Notes (Signed)
   Subjective:    Patient ID: Jeffery Massey, male    DOB: [birth year], 53 y.o.   MRN: [id number]  HPI    Review of Systems  All other systems reviewed and are negative.      Objective:   Physical Exam        Assessment & Plan:

## 2019-04-07 NOTE — Patient Instructions (Signed)

## 2019-04-08 NOTE — Progress Notes (Signed)
Subjective:   Patient ID: Jeffery Massey, male   DOB: 53 y.o.   MRN: [id number]   HPI Patient presents with 2 problems with one being quite a bit of discomfort in the outside of the left foot times approximate 4 months duration small bump back of the right heel that is not symptomatic but he wanted checked.  Patient does not smoke likes to be active   Review of Systems  All other systems reviewed and are negative.       Objective:  Physical Exam Vitals signs and nursing note reviewed.  Constitutional:      Appearance: He is well-developed.  Pulmonary:     Effort: Pulmonary effort is normal.  Musculoskeletal: Normal range of motion.  Skin:    General: Skin is warm.  Neurological:     Mental Status: He is alert.     Neurovascular status intact muscle strength adequate range of motion within normal limits with patient found to have exquisite discomfort in the lateral aspect left foot at the base of fifth metatarsal peroneal brevis insertion.  There was but no muscle strength loss associated with this and it is tender pinpoint at the insertion into the fifth metatarsal base.  On the right I noted posterior prominence but no pain with no indications of equinus     Assessment:  Appears to be acute peroneal tendinitis at insertion base of fifth metatarsal with probability for small posterior spur right Achilles tendon insertion     Plan:  H&P reviewed both conditions and get a focus on treatment of the left.  I did a sterile prep and injected the fifth metatarsal base 3 mg Dexasone Kenalog 5 mg Xylocaine and applied fascial brace to lift up the lateral side of the foot.  For the right I gave stretching exercises and told him to be careful with shoe gear and patient will be seen back to recheck  X-rays indicate mild prominence around the fifth metatarsal base bilateral with no indications of acute trauma with small posterior spur right and patient also placed on diclofenac 75 mg  twice daily

## 2019-04-21 DIAGNOSIS — Z808 Family history of malignant neoplasm of other organs or systems: Secondary | ICD-10-CM | POA: Diagnosis not present

## 2019-04-21 DIAGNOSIS — L814 Other melanin hyperpigmentation: Secondary | ICD-10-CM | POA: Diagnosis not present

## 2019-04-21 DIAGNOSIS — Z23 Encounter for immunization: Secondary | ICD-10-CM | POA: Diagnosis not present

## 2019-04-21 DIAGNOSIS — D2272 Melanocytic nevi of left lower limb, including hip: Secondary | ICD-10-CM | POA: Diagnosis not present

## 2019-04-21 DIAGNOSIS — D225 Melanocytic nevi of trunk: Secondary | ICD-10-CM | POA: Diagnosis not present

## 2019-11-07 DIAGNOSIS — Z23 Encounter for immunization: Secondary | ICD-10-CM | POA: Diagnosis not present

## 2019-11-10 DIAGNOSIS — D763 Other histiocytosis syndromes: Secondary | ICD-10-CM | POA: Diagnosis not present

## 2019-11-10 DIAGNOSIS — D485 Neoplasm of uncertain behavior of skin: Secondary | ICD-10-CM | POA: Diagnosis not present

## 2019-11-10 DIAGNOSIS — L539 Erythematous condition, unspecified: Secondary | ICD-10-CM | POA: Diagnosis not present

## 2019-11-10 DIAGNOSIS — D2230 Melanocytic nevi of unspecified part of face: Secondary | ICD-10-CM | POA: Diagnosis not present

## 2019-11-10 DIAGNOSIS — D2272 Melanocytic nevi of left lower limb, including hip: Secondary | ICD-10-CM | POA: Diagnosis not present
